# Patient Record
Sex: Female | Born: 1995 | Hispanic: Yes | Marital: Single | Smoking: Never smoker
Health system: Southern US, Community
[De-identification: ages and names within clinical notes are randomized; demographics above are authoritative.]

## PROBLEM LIST (undated history)

## (undated) HISTORY — PX: TYMPANOSTOMY TUBE PLACEMENT: SHX32

---
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
## 2016-02-19 ENCOUNTER — Encounter: Payer: Self-pay | Admitting: Emergency Medicine

## 2016-02-19 ENCOUNTER — Emergency Department
Admission: EM | Admit: 2016-02-19 | Discharge: 2016-02-19 | Disposition: A | Discharge status: Home / Self Care | Payer: Medicaid Other | Attending: Student | Admitting: Student

## 2016-02-19 DIAGNOSIS — L600 Ingrowing nail: Secondary | ICD-10-CM | POA: Diagnosis not present

## 2016-02-19 DIAGNOSIS — M79675 Pain in left toe(s): Secondary | ICD-10-CM | POA: Diagnosis present

## 2016-02-19 MED ORDER — IBUPROFEN 800 MG PO TABS: 800.0000 mg | ORAL_TABLET | Freq: Three times a day (TID) | ORAL | Status: AC | PRN

## 2016-02-19 MED ORDER — SULFAMETHOXAZOLE-TRIMETHOPRIM 800-160 MG PO TABS: 1.0000 | ORAL_TABLET | Freq: Two times a day (BID) | ORAL | Status: AC

## 2016-02-19 MED ORDER — TRAMADOL HCL 50 MG PO TABS: 50.0000 mg | ORAL_TABLET | Freq: Four times a day (QID) | ORAL | Status: AC | PRN

## 2016-02-19 NOTE — ED Provider Notes (Signed)
[facility] Emergency Department Provider Note   ____________________________________________  Time seen: Approximately 1:57 PM  I have reviewed the triage vital signs and the nursing notes.   HISTORY  Chief Complaint Toe Pain    HPI Amanda Vaughan is a 20 y.o. female patient complain left great toe pain for 1 month. Patient denies any injury. Patient states the nail is ingrown . Patient state the right toe is also affected with tinnitus great as the left toe. Patient has not tried any palliative measures for this complaint. Patient rated the pain discomfort as a 5/10. She described a pain as dull.  History reviewed. No pertinent past medical history.  There are no active problems to display for this patient.   Past Surgical History  Procedure Laterality Date  . Tympanostomy tube placement      left ear    Current Outpatient Rx  Name  Route  Sig  Dispense  Refill  . ibuprofen (ADVIL,MOTRIN) 800 MG tablet   Oral   Take 1 tablet (800 mg total) by mouth every 8 (eight) hours as needed.   30 tablet   0   . Prenatal Vit-Fe Fumarate-FA (PRENATAL MULTIVITAMIN) TABS tablet   Oral   Take 1 tablet by mouth daily at 12 noon.         . sulfamethoxazole-trimethoprim (BACTRIM DS,SEPTRA DS) 800-160 MG tablet   Oral   Take 1 tablet by mouth 2 (two) times daily.   20 tablet   0   . traMADol (ULTRAM) 50 MG tablet   Oral   Take 1 tablet (50 mg total) by mouth every 6 (six) hours as needed for moderate pain.   12 tablet   0     Allergies Review of patient's allergies indicates no known allergies.  No family history on file.  Social History Social History  Substance Use Topics  . Smoking status: Never Smoker   . Smokeless tobacco: None  . Alcohol Use: No    Review of Systems Constitutional: No fever/chills Eyes: No visual changes. ENT: No sore throat. Cardiovascular: Denies chest pain. Respiratory: Denies shortness of  breath. Gastrointestinal: No abdominal pain.  No nausea, no vomiting.  No diarrhea.  No constipation. Genitourinary: Negative for dysuria. Musculoskeletal: Negative for back pain. Skin: Negative for rash. Ingrown toenail Neurological: Negative for headaches, focal weakness or numbness.    ____________________________________________   PHYSICAL EXAM:  VITAL SIGNS: ED Triage Vitals  Enc Vitals Group     BP 02/19/16 1251 136/68 mmHg     Pulse Rate 02/19/16 1251 86     Resp 02/19/16 1251 16     Temp 02/19/16 1251 97.9 F (36.6 C)     Temp Source 02/19/16 1251 Oral     SpO2 02/19/16 1251 96 %     Weight 02/19/16 1251 200 lb (90.719 kg)     Height 02/19/16 1251  (1.727 m)     Head Cir --      Peak Flow --      Pain Score 02/19/16 1245 5     Pain Loc --      Pain Edu? --      Excl. in GC? --     Constitutional: Alert and oriented. Well appearing and in no acute distress. Eyes: Conjunctivae are normal. PERRL. EOMI. Head: Atraumatic. Nose: No congestion/rhinnorhea. Mouth/Throat: Mucous membranes are moist.  Oropharynx non-erythematous. Neck: No stridor. No cervical spine tenderness to palpation. Cardiovascular: Normal rate, regular rhythm. Grossly normal  heart sounds.  Good peripheral circulation. Respiratory: Normal respiratory effort.  No retractions. Lungs CTAB. Gastrointestinal: Soft and nontender. No distention. No abdominal bruits. No CVA tenderness. Musculoskeletal: No lower extremity tenderness nor edema.  No joint effusions. Neurologic:  Normal speech and language. No gross focal neurologic deficits are appreciated. No gait instability. Skin:  Skin is warm, dry and intact. No rash noted.Mild edema and erythema medial and lateral nail bed of the left great toe. Psychiatric: Mood and affect are normal. Speech and behavior are normal.  ____________________________________________   LABS (all labs ordered are listed, but only abnormal results are displayed)  Labs  Reviewed - No data to display ____________________________________________  EKG   ____________________________________________  RADIOLOGY   ____________________________________________   PROCEDURES  Procedure(s) performed: None  Critical Care performed: No  ____________________________________________   INITIAL IMPRESSION / ASSESSMENT AND PLAN / ED COURSE  Pertinent labs & imaging results that were available during my care of the patient were reviewed by me and considered in my medical decision making (see chart for details).  Infected ingrown left toenail. Patient given discharge Instructions. Patient given a prescription for Bactrim DS, tramadol, ibuprofen. Patient advised to contact podiatry clinic in 3 days to schedule follow-up appointment. ____________________________________________   FINAL CLINICAL IMPRESSION(S) / ED DIAGNOSES  Final diagnoses:  Ingrown left big toenail      NEW MEDICATIONS STARTED DURING THIS VISIT:  New Prescriptions   IBUPROFEN (ADVIL,MOTRIN) 800 MG TABLET    Take 1 tablet (800 mg total) by mouth every 8 (eight) hours as needed.   SULFAMETHOXAZOLE-TRIMETHOPRIM (BACTRIM DS,SEPTRA DS) 800-160 MG TABLET    Take 1 tablet by mouth 2 (two) times daily.   TRAMADOL (ULTRAM) 50 MG TABLET    Take 1 tablet (50 mg total) by mouth every 6 (six) hours as needed for moderate pain.     Note:  This document was prepared using Dragon voice recognition software and may include unintentional dictation errors.    Joni Reiningonald K Smith, PA-C 02/19/16 1409  Gayla DossEryka A Gayle, MD 02/19/16 [phone]

## 2016-02-19 NOTE — ED Notes (Signed)
C/o toe nail pain to left great toe

## 2016-02-19 NOTE — ED Notes (Signed)
Toe pain x 1 month denies injury.  Pt states it is changing colors to purple and pink and the nail is stuck.  Pt has not tried any home remedies to treat. Pt is ambulatory but is painful.

## 2016-04-26 ENCOUNTER — Other Ambulatory Visit: Payer: Self-pay | Admitting: Family Medicine

## 2016-04-26 DIAGNOSIS — Z3490 Encounter for supervision of normal pregnancy, unspecified, unspecified trimester: Secondary | ICD-10-CM

## 2016-04-29 ENCOUNTER — Other Ambulatory Visit: Payer: Self-pay | Admitting: Family Medicine

## 2016-04-29 DIAGNOSIS — Z3490 Encounter for supervision of normal pregnancy, unspecified, unspecified trimester: Secondary | ICD-10-CM

## 2016-05-03 ENCOUNTER — Ambulatory Visit
Admission: RE | Admit: 2016-05-03 | Discharge: 2016-05-03 | Disposition: A | Discharge status: Home / Self Care | Payer: Medicaid Other | Source: Ambulatory Visit | Attending: Family Medicine | Admitting: Family Medicine

## 2016-05-03 DIAGNOSIS — Z331 Pregnant state, incidental: Secondary | ICD-10-CM | POA: Insufficient documentation

## 2016-05-03 DIAGNOSIS — Z3481 Encounter for supervision of other normal pregnancy, first trimester: Secondary | ICD-10-CM | POA: Diagnosis not present

## 2016-05-03 DIAGNOSIS — Z3A08 8 weeks gestation of pregnancy: Secondary | ICD-10-CM | POA: Insufficient documentation

## 2016-05-03 DIAGNOSIS — Z3490 Encounter for supervision of normal pregnancy, unspecified, unspecified trimester: Secondary | ICD-10-CM

## 2017-04-21 ENCOUNTER — Emergency Department
Admission: EM | Admit: 2017-04-21 | Discharge: 2017-04-22 | Disposition: A | Discharge status: Home / Self Care | Payer: Medicaid Other | Attending: Emergency Medicine | Admitting: Emergency Medicine

## 2017-04-21 DIAGNOSIS — Z79899 Other long term (current) drug therapy: Secondary | ICD-10-CM | POA: Diagnosis not present

## 2017-04-21 DIAGNOSIS — R1010 Upper abdominal pain, unspecified: Secondary | ICD-10-CM | POA: Insufficient documentation

## 2017-04-21 LAB — CBC
HEMATOCRIT: 32.6 % — AB (ref 35.0–47.0)
Hemoglobin: 10.5 g/dL — ABNORMAL LOW (ref 12.0–16.0)
MCH: 23.7 pg — ABNORMAL LOW (ref 26.0–34.0)
MCHC: 32.3 g/dL (ref 32.0–36.0)
MCV: 73.4 fL — AB (ref 80.0–100.0)
Platelets: 338 10*3/uL (ref 150–440)
RBC: 4.45 MIL/uL (ref 3.80–5.20)
RDW: 16.9 % — AB (ref 11.5–14.5)
WBC: 11.7 10*3/uL — AB (ref 3.6–11.0)

## 2017-04-21 LAB — LIPASE, BLOOD: LIPASE: 35 U/L (ref 11–51)

## 2017-04-21 LAB — COMPREHENSIVE METABOLIC PANEL
ALBUMIN: 4.6 g/dL (ref 3.5–5.0)
ALT: 75 U/L — ABNORMAL HIGH (ref 14–54)
ANION GAP: 8 (ref 5–15)
AST: 58 U/L — AB (ref 15–41)
Alkaline Phosphatase: 122 U/L (ref 38–126)
BILIRUBIN TOTAL: 0.4 mg/dL (ref 0.3–1.2)
BUN: 12 mg/dL (ref 6–20)
CO2: 26 mmol/L (ref 22–32)
Calcium: 9.2 mg/dL (ref 8.9–10.3)
Chloride: 103 mmol/L (ref 101–111)
Creatinine, Ser: 0.65 mg/dL (ref 0.44–1.00)
GFR calc Af Amer: >60 mL/min (ref >60)
GFR calc non Af Amer: >60 mL/min (ref >60)
GLUCOSE: 112 mg/dL — AB (ref 65–99)
POTASSIUM: 3.7 mmol/L (ref 3.5–5.1)
Sodium: 137 mmol/L (ref 135–145)
TOTAL PROTEIN: 8.6 g/dL — AB (ref 6.5–8.1)

## 2017-04-21 NOTE — ED Triage Notes (Signed)
Patient reports lower abdominal and lower back pain for approximately 4 months (reports since birth of her child).  Patient reports being seen by OB/GYN but told if continues to come to the ED.

## 2017-04-22 ENCOUNTER — Emergency Department: Payer: Medicaid Other

## 2017-04-22 LAB — URINALYSIS, COMPLETE (UACMP) WITH MICROSCOPIC
BILIRUBIN URINE: NEGATIVE
Glucose, UA: NEGATIVE mg/dL
Hgb urine dipstick: NEGATIVE
Ketones, ur: 5 mg/dL — AB
LEUKOCYTES UA: NEGATIVE
Nitrite: NEGATIVE
Protein, ur: 30 mg/dL — AB
Specific Gravity, Urine: 1.035 — ABNORMAL HIGH (ref 1.005–1.030)
pH: 5 (ref 5.0–8.0)

## 2017-04-22 LAB — POCT PREGNANCY, URINE: PREG TEST UR: NEGATIVE

## 2017-04-22 MED ORDER — OMEPRAZOLE MAGNESIUM 20 MG PO TBEC
20.0000 mg | DELAYED_RELEASE_TABLET | Freq: Every day | ORAL | 1 refills | Status: AC
Start: 2017-04-22 — End: 2018-04-22

## 2017-04-22 NOTE — ED Notes (Signed)
Patient transported to Ultrasound 

## 2017-04-22 NOTE — ED Provider Notes (Signed)
[facility] Emergency Department Provider Note  ____________________________________________   First MD Initiated Contact with Patient 04/22/17 0040     (approximate)  I have reviewed the triage vital signs and the nursing notes.   HISTORY  Chief Complaint Abdominal Pain and Back Pain    HPI Amanda Vaughan is a 21 y.o. female who is otherwise healthy and presents by private vehicle for evaluation of 4 months of intermittent epigastric abdominal pain radiating through to her back.  She reports that it started during the time she was pregnant and has continued after giving birth 4 months ago without any labor or delivery complications.  She reports that eating makes the pain worse and nothing in particular makes it better.  She describes it as being in her upper abdomen and epigastric region, sometimes on the upper right side.  She occasionally has nausea associated with the pain but does not have any vomiting or diarrhea.  She has not had any vaginal bleeding or lower abdominal pain.  She has been to her OB/GYN but they suggested she come to the emergency department if the symptoms continue.  She has never had any surgeries such as cholecystectomy or appendectomy.  She denies fever/chills, chest pain, shortness of breath, dysuria.   No past medical history on file.  There are no active problems to display for this patient.   Past Surgical History:  Procedure Laterality Date  . TYMPANOSTOMY TUBE PLACEMENT     left ear    Prior to Admission medications   Medication Sig Start Date End Date Taking? Authorizing Provider  ibuprofen (ADVIL,MOTRIN) 800 MG tablet Take 1 tablet (800 mg total) by mouth every 8 (eight) hours as needed. 02/19/16   Joni Reining, PA-C  omeprazole (PRILOSEC OTC) 20 MG tablet Take 1 tablet (20 mg total) by mouth daily. 04/22/17 04/22/18  Loleta Rose, MD  Prenatal Vit-Fe Fumarate-FA (PRENATAL MULTIVITAMIN) TABS tablet Take 1 tablet  by mouth daily at 12 noon.    [provider]  sulfamethoxazole-trimethoprim (BACTRIM DS,SEPTRA DS) 800-160 MG tablet Take 1 tablet by mouth 2 (two) times daily. 02/19/16   Joni Reining, PA-C  traMADol (ULTRAM) 50 MG tablet Take 1 tablet (50 mg total) by mouth every 6 (six) hours as needed for moderate pain. 02/19/16   Joni Reining, PA-C    Allergies Patient has no known allergies.  No family history on file.  Social History Social History  Substance Use Topics  . Smoking status: Never Smoker  . Smokeless tobacco: Not on file  . Alcohol use No    Review of Systems Constitutional: No fever/chills Eyes: No visual changes. ENT: No sore throat. Cardiovascular: Denies chest pain. Respiratory: Denies shortness of breath. Gastrointestinal: Episodic epigastric and upper abdominal pain radiating to her back with occasional nausea, no vomiting.  No diarrhea.  No constipation. Genitourinary: Negative for dysuria. Musculoskeletal: Negative for neck pain.  Negative for back pain. Integumentary: Negative for rash. Neurological: Negative for headaches, focal weakness or numbness.   ____________________________________________   PHYSICAL EXAM:  VITAL SIGNS: ED Triage Vitals  Enc Vitals Group     BP 04/21/17 2131 (!) 147/73     Pulse Rate 04/21/17 2131 63     Resp 04/21/17 2131 18     Temp 04/21/17 2131 98.6 F (37 C)     Temp Source 04/21/17 2131 Oral     SpO2 04/21/17 2131 99 %     Weight 04/21/17 2131 97.3 kg (214  lb 8.1 oz)     Height 04/21/17 2131 1.727 m (5\' 8" )     Head Circumference --      Peak Flow --      Pain Score 04/21/17 2130 5     Pain Loc --      Pain Edu? --      Excl. in GC? --     Constitutional: Alert and oriented. Well appearing and in no acute distress. Eyes: Conjunctivae are normal.  Head: Atraumatic. Nose: No congestion/rhinnorhea. Mouth/Throat: Mucous membranes are moist. Neck: No stridor.  No meningeal signs.   Cardiovascular: Normal  rate, regular rhythm. Good peripheral circulation. Grossly normal heart sounds. Respiratory: Normal respiratory effort.  No retractions. Lungs CTAB. Gastrointestinal: Soft and nontender. No distention.  Musculoskeletal: No lower extremity tenderness nor edema. No gross deformities of extremities. Neurologic:  Normal speech and language. No gross focal neurologic deficits are appreciated.  Skin:  Skin is warm, dry and intact. No rash noted. Psychiatric: Mood and affect are normal. Speech and behavior are normal.  ____________________________________________   LABS (all labs ordered are listed, but only abnormal results are displayed)  Labs Reviewed  COMPREHENSIVE METABOLIC PANEL - Abnormal; Notable for the following:       Result Value   Glucose, Bld 112 (*)    Total Protein 8.6 (*)    AST 58 (*)    ALT 75 (*)    All other components within normal limits  CBC - Abnormal; Notable for the following:    WBC 11.7 (*)    Hemoglobin 10.5 (*)    HCT 32.6 (*)    MCV 73.4 (*)    MCH 23.7 (*)    RDW 16.9 (*)    All other components within normal limits  URINALYSIS, COMPLETE (UACMP) WITH MICROSCOPIC - Abnormal; Notable for the following:    Color, Urine YELLOW (*)    APPearance HAZY (*)    Specific Gravity, Urine 1.035 (*)    Ketones, ur 5 (*)    Protein, ur 30 (*)    Bacteria, UA RARE (*)    Squamous Epithelial / LPF 6-30 (*)    All other components within normal limits  URINE CULTURE  LIPASE, BLOOD  POC URINE PREG, ED  POCT PREGNANCY, URINE   ____________________________________________  EKG  None - EKG not ordered by ED physician ____________________________________________  RADIOLOGY   [place] Abdomen Limited Ruq  Result Date: 04/22/2017 CLINICAL DATA:  Chronic postprandial epigastric abdominal pain, radiating to the back. Initial encounter. EXAM: ULTRASOUND ABDOMEN LIMITED RIGHT UPPER QUADRANT COMPARISON:  None. FINDINGS: Gallbladder: No gallstones or wall thickening  visualized. Contracted and grossly unremarkable in appearance. No sonographic Murphy sign noted by sonographer. Common bile duct: Diameter: 0.4 cm, within normal limits in caliber. Liver: No focal lesion identified. Diffusely increased parenchymal echogenicity and coarsened echotexture, compatible with fatty infiltration. IMPRESSION: 1. No acute abnormality seen at the right upper quadrant. Gallbladder unremarkable in appearance. 2. Diffuse fatty infiltration within the liver. Electronically Signed   By: Roanna RaiderJeffery  Chang M.D.   On: 04/22/2017 01:52    ____________________________________________   PROCEDURES  Critical Care performed: No   Procedure(s) performed:   Procedures   ____________________________________________   INITIAL IMPRESSION / ASSESSMENT AND PLAN / ED COURSE  Pertinent labs & imaging results that were available during my care of the patient were reviewed by me and considered in my medical decision making (see chart for details).  The patient is well-appearing and in no acute distress.  She has no tenderness to palpation at this time.  Which she is describing sounds like she might be suffering from biliary colic.  There is no evidence of any acute or emergent condition currently, but I will proceed with a right upper quadrant ultrasound to look for signs of gallstones as this would at least provide a diagnosis for her.  She agrees with the current plan.   Clinical Course as of Apr 22 317  Sat Apr 22, 2017  0219 Unremarkable U/S with no evidence of gallbladder disease.  Will discuss GERD with patient and recommend PPI and outpatient follow up with primary care.  No evidence of acute/emergent medical condition at this time.   US Abdomen Limited RUQ [CF]    Clinical Course User Index [CF] Loleta Rose, MD    ____________________________________________  FINAL CLINICAL IMPRESSION(S) / ED DIAGNOSES  Final diagnoses:  Upper abdominal pain     MEDICATIONS GIVEN  DURING THIS VISIT:  Medications - No data to display   NEW OUTPATIENT MEDICATIONS STARTED DURING THIS VISIT:  New Prescriptions   OMEPRAZOLE (PRILOSEC OTC) 20 MG TABLET    Take 1 tablet (20 mg total) by mouth daily.    Modified Medications   No medications on file    Discontinued Medications   No medications on file     Note:  This document was prepared using Dragon voice recognition software and may include unintentional dictation errors.    Loleta Rose, MD 04/22/17 [id number]

## 2017-04-22 NOTE — ED Notes (Signed)
Pt denies CP, SOB, LOC, fevers or dizziness. Pt denies vaginal discharge or bleeding. Pt reports a vaginal birth, full term pregnancy w/o complications.  Pt A/OX4, resp even and unlabored.

## 2017-04-22 NOTE — Discharge Instructions (Signed)
You have been seen in the Emergency Department (ED) for abdominal pain.  Your evaluation did not identify a clear cause of your symptoms but was generally reassuring.  Please consider trying an over-the-counter medication for acide reflux (GERD) such as Prilosec, which may help with your symptoms and should not cause problems for your baby while breastfeeding.  Please follow up as instructed above regarding today?s emergent visit and the symptoms that are bothering you.  Return to the ED if your abdominal pain worsens or fails to improve, you develop bloody vomiting, bloody diarrhea, you are unable to tolerate fluids due to vomiting, fever greater than 101, or other symptoms that concern you.

## 2017-04-23 LAB — URINE CULTURE: Special Requests: NORMAL

## 2017-04-24 NOTE — Progress Notes (Signed)
PHARMACY CONSULT NOTE - INITIAL   Pharmacy Consult for ED CULTURE RESULTS  No Known Allergies  Patient Measurements: Height: 5\' 8"  (172.7 cm) Weight: 214 lb 8.1 oz (97.3 kg) IBW/kg (Calculated) : 63.9   Microbiology: Recent Results (from the past 720 hour(s))  Urine Culture     Status: Abnormal   Collection Time: 04/21/17  9:35 PM  Result Value Ref Range Status   Specimen Description URINE, CLEAN CATCH  Final   Special Requests Normal  Final   Culture MULTIPLE ORGANISMS PRESENT, NONE PREDOMINANT (A)  Final   Report Status 04/23/2017 FINAL  Final     Assessment: 21 yo female seen in ED with upper abdominal pain. Patient discharged on PPI for possible GERD. UCx showing multiple organisms. UA showed rare bacteria, 0-5 WBC, no leukocytes and 6-30 squamous cells.    Plan:  After discussion with Dr. Derrill KayGoodman, no Abx will be prescribed at this time. Patient did not report any urinary symptoms and UA does not appear to be a clean catch.   Gardner CandleSheema M Arsenio Schnorr, PharmD, BCPS Clinical Pharmacist 04/24/2017 7:42 PM

## 2017-05-15 IMAGING — US US OB TRANSVAGINAL
1 series · 14 of 28 positions shown · non-contrast
Comparison: None.

CLINICAL DATA: Irregular menses. Unsure of LMP. Gestational age by
LMP of 11 weeks 6 days.

EXAM:
OBSTETRIC <14 WK US AND TRANSVAGINAL OB US
TECHNIQUE: Both transabdominal and transvaginal ultrasound examinations were
performed for complete evaluation of the gestation as well as the
maternal uterus, adnexal regions, and pelvic cul-de-sac.
Transvaginal technique was performed to assess early pregnancy.

[Series 1: us ob transvaginal · 0.31mm/px · 88 acquisitions, 14 frames shown]
[im 4/88]
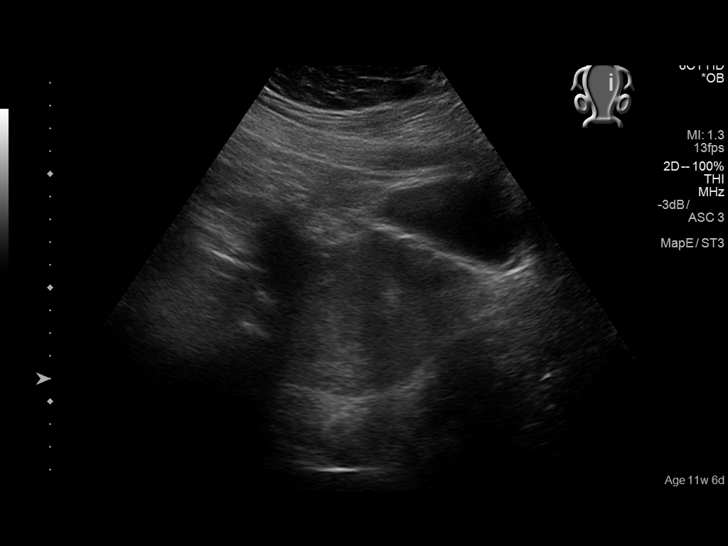
[im 10/88]
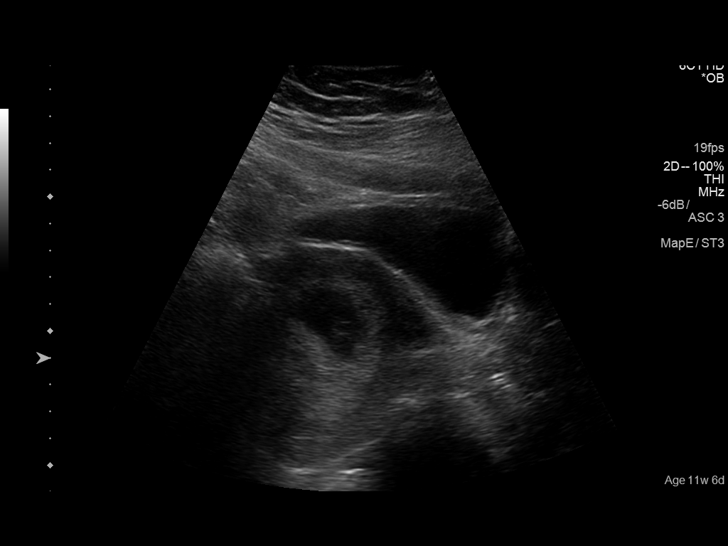
[im 17/88]
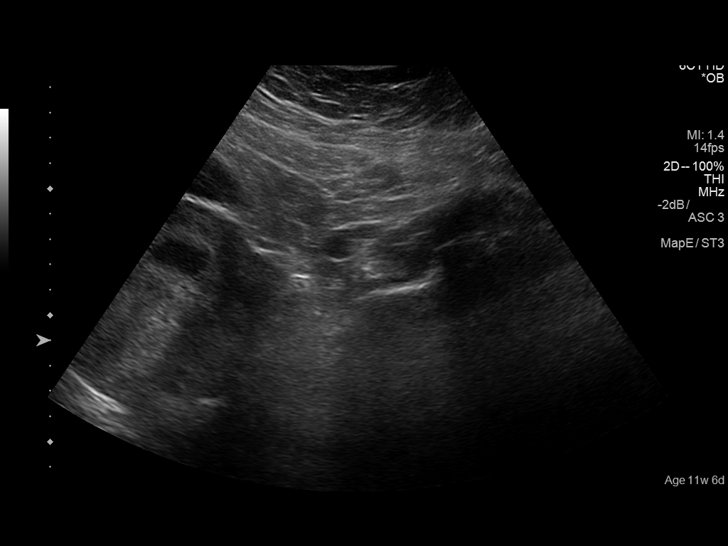
[im 23/88]
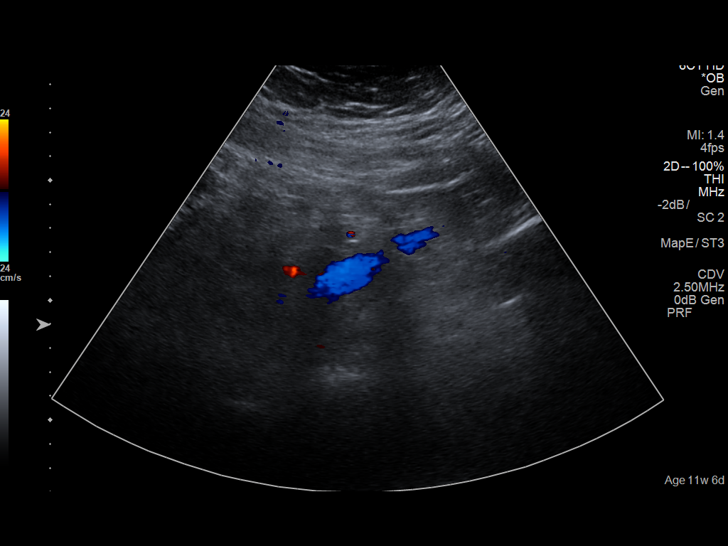
[im 30/88]
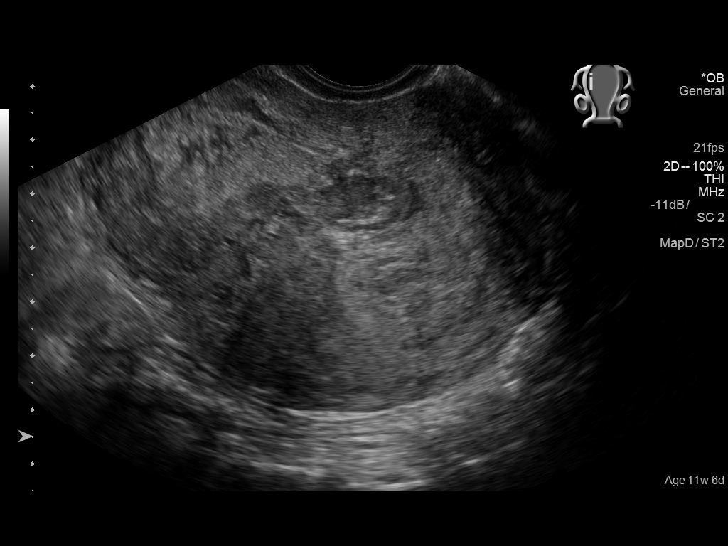
[im 36/88]
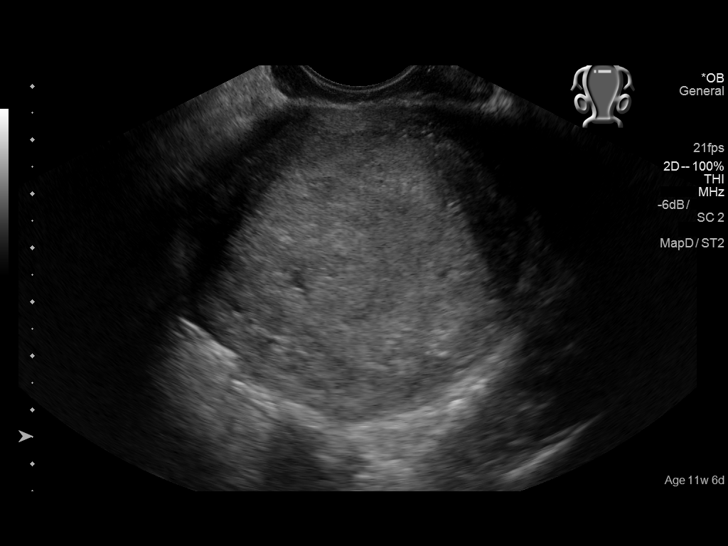
[im 42/88]
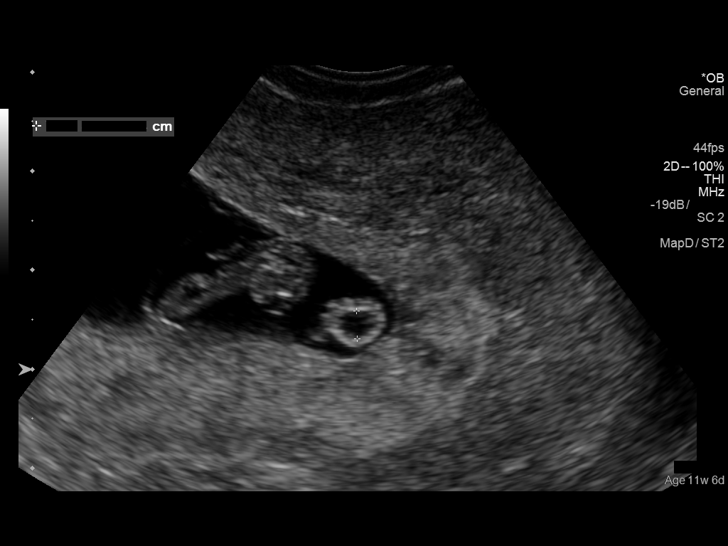
[im 49/88]
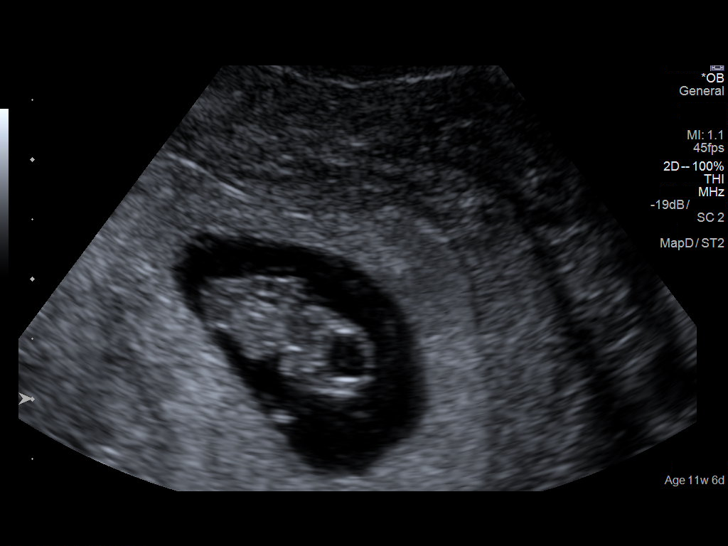
[im 55/88]
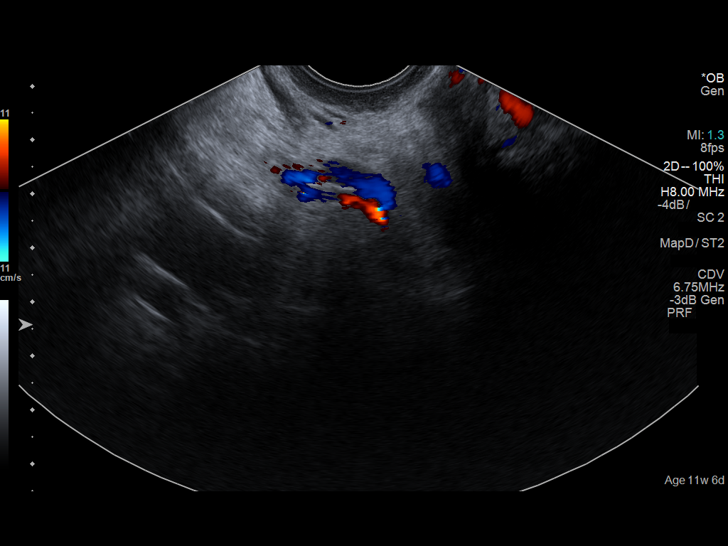
[im 62/88]
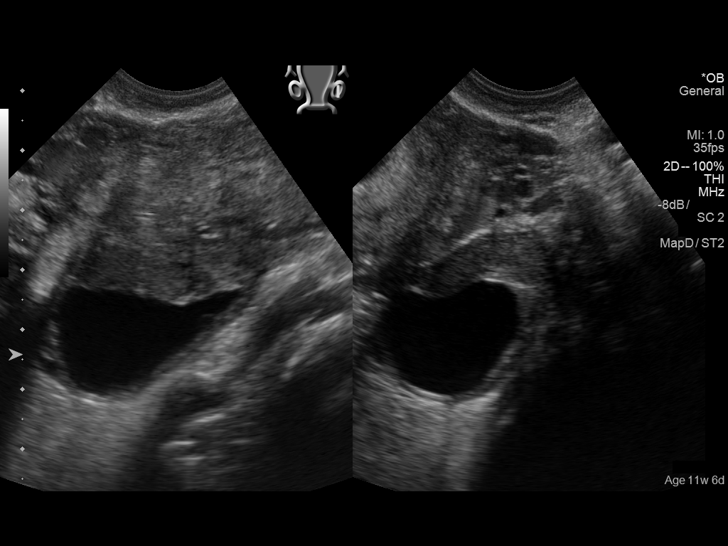
[im 68/88]
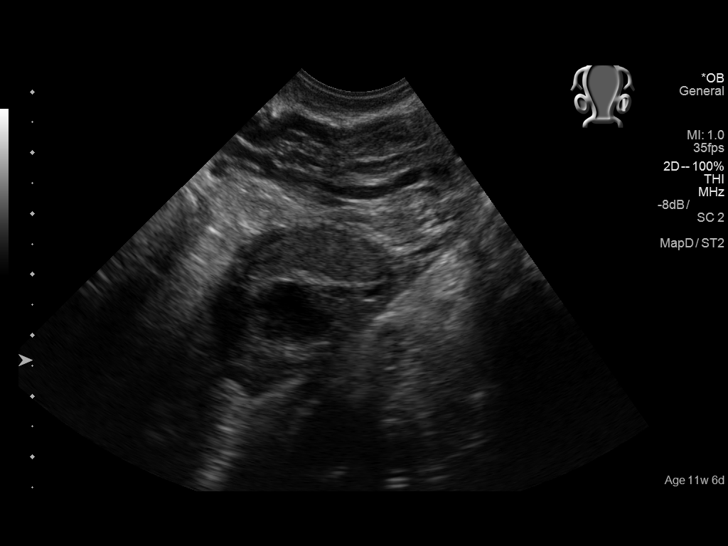
[im 75/88]
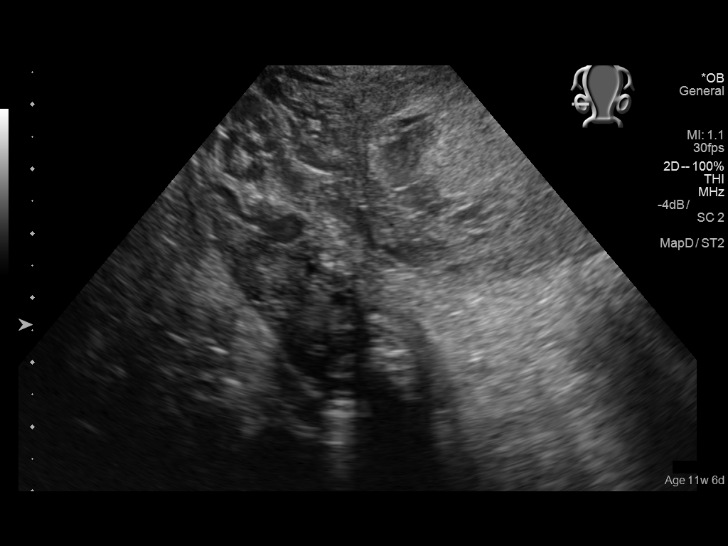
[im 81/88]
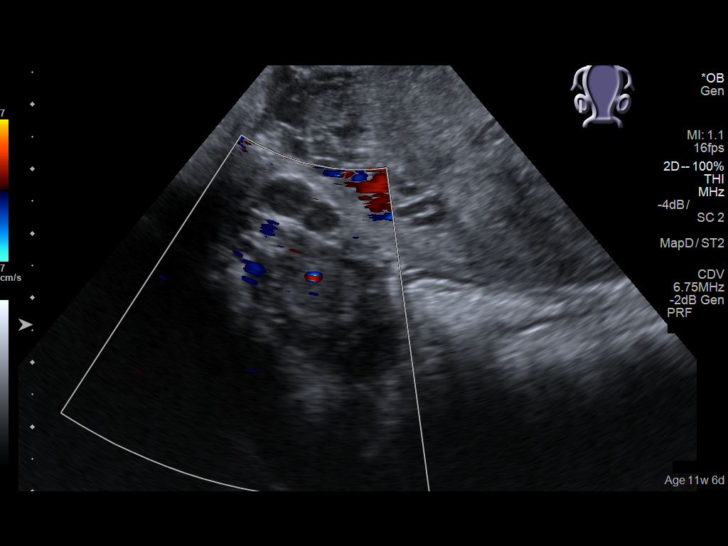
[im 88/88]
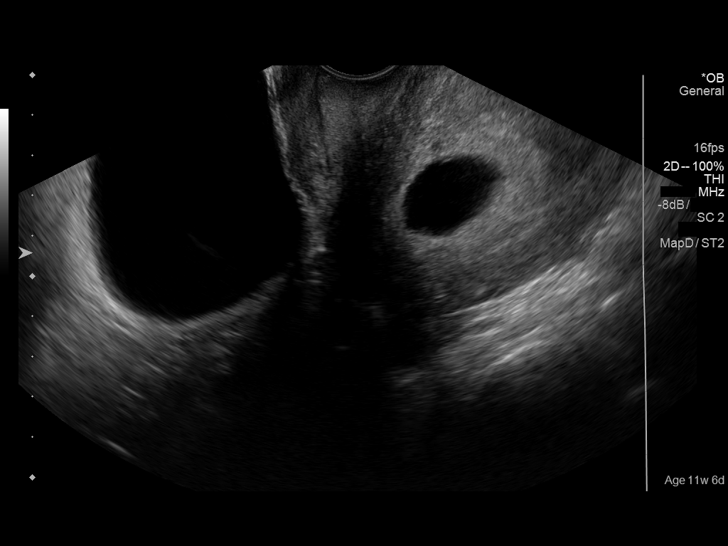

[14 of 28 positions shown; findings below may reference images not displayed]

FINDINGS: Intrauterine gestational sac: Single

Yolk sac:  Visualized

Embryo:  Visualized

Cardiac Activity: Visualized

Heart Rate: 175  bpm

CRL:  16  mm   8 w   0 d                  US EDC: 12/13/2016

Subchorionic hemorrhage:  None visualized.

Maternal uterus/adnexae: Retroverted uterus. 3 cm left ovarian
corpus luteum cyst noted. Normal appearance of right ovary. No mass
or free fluid identified.
IMPRESSION: Single living IUP measuring 8 weeks 0 days with US EDC of
12/13/2016.

No significant maternal uterine or adnexal abnormality identified.

## 2017-10-13 ENCOUNTER — Other Ambulatory Visit: Payer: Self-pay | Admitting: Family Medicine

## 2017-10-13 DIAGNOSIS — R102 Pelvic and perineal pain: Secondary | ICD-10-CM

## 2017-10-16 ENCOUNTER — Other Ambulatory Visit: Payer: Self-pay | Admitting: Family Medicine

## 2017-10-16 DIAGNOSIS — R102 Pelvic and perineal pain: Secondary | ICD-10-CM

## 2017-12-08 ENCOUNTER — Other Ambulatory Visit: Payer: Self-pay | Admitting: Family Medicine

## 2017-12-08 ENCOUNTER — Other Ambulatory Visit: Payer: Self-pay | Admitting: Obstetrics and Gynecology

## 2017-12-08 DIAGNOSIS — R102 Pelvic and perineal pain: Secondary | ICD-10-CM

## 2017-12-12 ENCOUNTER — Ambulatory Visit
Admission: RE | Admit: 2017-12-12 | Discharge: 2017-12-12 | Disposition: A | Discharge status: Home / Self Care | Payer: Medicaid Other | Source: Ambulatory Visit | Attending: Obstetrics and Gynecology | Admitting: Obstetrics and Gynecology

## 2017-12-12 DIAGNOSIS — R9389 Abnormal findings on diagnostic imaging of other specified body structures: Secondary | ICD-10-CM | POA: Diagnosis not present

## 2017-12-12 DIAGNOSIS — R102 Pelvic and perineal pain: Secondary | ICD-10-CM | POA: Diagnosis present

## 2023-10-07 ENCOUNTER — Emergency Department
Admission: EM | Admit: 2023-10-07 | Discharge: 2023-10-08 | Discharge status: Against Medical Advice | Payer: Medicaid Other | Attending: Emergency Medicine | Admitting: Emergency Medicine

## 2023-10-07 DIAGNOSIS — R519 Headache, unspecified: Secondary | ICD-10-CM | POA: Insufficient documentation

## 2023-10-07 DIAGNOSIS — Z1152 Encounter for screening for COVID-19: Secondary | ICD-10-CM | POA: Diagnosis not present

## 2023-10-07 DIAGNOSIS — R42 Dizziness and giddiness: Secondary | ICD-10-CM | POA: Insufficient documentation

## 2023-10-07 DIAGNOSIS — Z5321 Procedure and treatment not carried out due to patient leaving prior to being seen by health care provider: Secondary | ICD-10-CM | POA: Diagnosis not present

## 2023-10-08 ENCOUNTER — Other Ambulatory Visit: Payer: Self-pay

## 2023-10-08 LAB — URINALYSIS, ROUTINE W REFLEX MICROSCOPIC
Bilirubin Urine: NEGATIVE
Glucose, UA: NEGATIVE mg/dL
Hgb urine dipstick: NEGATIVE
Ketones, ur: NEGATIVE mg/dL
Nitrite: NEGATIVE
Protein, ur: NEGATIVE mg/dL
Specific Gravity, Urine: 1.026 (ref 1.005–1.030)
pH: 5 (ref 5.0–8.0)

## 2023-10-08 LAB — COMPREHENSIVE METABOLIC PANEL
ALT: 86 U/L — ABNORMAL HIGH (ref 0–44)
AST: 53 U/L — ABNORMAL HIGH (ref 15–41)
Albumin: 4.1 g/dL (ref 3.5–5.0)
Alkaline Phosphatase: 108 U/L (ref 38–126)
Anion gap: 10 (ref 5–15)
BUN: 17 mg/dL (ref 6–20)
CO2: 23 mmol/L (ref 22–32)
Calcium: 9.1 mg/dL (ref 8.9–10.3)
Chloride: 105 mmol/L (ref 98–111)
Creatinine, Ser: 0.61 mg/dL (ref 0.44–1.00)
GFR, Estimated: >60 mL/min (ref >60)
Glucose, Bld: 98 mg/dL (ref 70–99)
Potassium: 3.6 mmol/L (ref 3.5–5.1)
Sodium: 138 mmol/L (ref 135–145)
Total Bilirubin: 0.3 mg/dL (ref <1.2)
Total Protein: 7.8 g/dL (ref 6.5–8.1)

## 2023-10-08 LAB — CBC
HCT: 33.8 % — ABNORMAL LOW (ref 36.0–46.0)
Hemoglobin: 10.8 g/dL — ABNORMAL LOW (ref 12.0–15.0)
MCH: 27.6 pg (ref 26.0–34.0)
MCHC: 32 g/dL (ref 30.0–36.0)
MCV: 86.2 fL (ref 80.0–100.0)
Platelets: 300 10*3/uL (ref 150–400)
RBC: 3.92 MIL/uL (ref 3.87–5.11)
RDW: 13.7 % (ref 11.5–15.5)
WBC: 8.4 10*3/uL (ref 4.0–10.5)
nRBC: 0 % (ref 0.0–0.2)

## 2023-10-08 LAB — RESP PANEL BY RT-PCR (RSV, FLU A&B, COVID)  RVPGX2
Influenza A by PCR: NEGATIVE
Influenza B by PCR: NEGATIVE
Resp Syncytial Virus by PCR: NEGATIVE
SARS Coronavirus 2 by RT PCR: NEGATIVE

## 2023-10-08 NOTE — ED Triage Notes (Signed)
Pt presents to ER with c/o dizziness, HA, and "brain fog" that have been ongoing for a few days.  Pt is 7 weeks postpartum.  Pt does report that her daughter has had a stomach bug, but has otherwise not been around anyone sick that she knows of.  Denies any abd pain, chest pain, fever, cough or chills at this time.  Pt is otherwise A&O x4 and in NAD at this time.
